# Patient Record
Sex: Male | Born: 1985 | Race: Black or African American | Hispanic: No | Marital: Married | Smoking: Current every day smoker
Health system: Southern US, Community
[De-identification: ages and names within clinical notes are randomized; demographics above are authoritative.]

---

## 2010-03-26 ENCOUNTER — Emergency Department (HOSPITAL_COMMUNITY): Admission: EM | Admit: 2010-03-26 | Discharge: 2010-03-26 | Payer: Self-pay | Admitting: Emergency Medicine

## 2010-03-28 ENCOUNTER — Inpatient Hospital Stay (HOSPITAL_COMMUNITY)
Admission: AD | Admit: 2010-03-28 | Discharge: 2010-04-03 | Payer: Self-pay | Source: Home / Self Care | Admitting: Specialist

## 2010-03-29 ENCOUNTER — Ambulatory Visit: Payer: Self-pay | Admitting: Infectious Disease

## 2010-08-01 LAB — CBC
HCT: 39.1 % (ref 39.0–52.0)
HCT: 39.7 % (ref 39.0–52.0)
HCT: 40.6 % (ref 39.0–52.0)
HCT: 41.8 % (ref 39.0–52.0)
HCT: 42.2 % (ref 39.0–52.0)
HCT: 43.5 % (ref 39.0–52.0)
HCT: 46.5 % (ref 39.0–52.0)
Hemoglobin: 13 g/dL (ref 13.0–17.0)
Hemoglobin: 13.2 g/dL (ref 13.0–17.0)
Hemoglobin: 13.6 g/dL (ref 13.0–17.0)
Hemoglobin: 14.1 g/dL (ref 13.0–17.0)
Hemoglobin: 14.8 g/dL (ref 13.0–17.0)
MCH: 26.9 pg (ref 26.0–34.0)
MCH: 27.6 pg (ref 26.0–34.0)
MCHC: 33.2 g/dL (ref 30.0–36.0)
MCHC: 33.2 g/dL (ref 30.0–36.0)
MCHC: 33.5 g/dL (ref 30.0–36.0)
MCHC: 34 g/dL (ref 30.0–36.0)
MCHC: 34.2 g/dL (ref 30.0–36.0)
MCV: 80.4 fL (ref 78.0–100.0)
MCV: 80.9 fL (ref 78.0–100.0)
MCV: 81.2 fL (ref 78.0–100.0)
RBC: 5.17 MIL/uL (ref 4.22–5.81)
RBC: 5.37 MIL/uL (ref 4.22–5.81)
RDW: 13.1 % (ref 11.5–15.5)
RDW: 13.1 % (ref 11.5–15.5)
RDW: 13.1 % (ref 11.5–15.5)
WBC: 12.2 10*3/uL — ABNORMAL HIGH (ref 4.0–10.5)
WBC: 12.7 10*3/uL — ABNORMAL HIGH (ref 4.0–10.5)
WBC: 16.8 10*3/uL — ABNORMAL HIGH (ref 4.0–10.5)
WBC: 8.9 10*3/uL (ref 4.0–10.5)

## 2010-08-01 LAB — CULTURE, BLOOD (ROUTINE X 2)
Culture  Setup Time: 201111082130
Culture: NO GROWTH

## 2010-08-01 LAB — BASIC METABOLIC PANEL
CO2: 29 mEq/L (ref 19–32)
CO2: 32 mEq/L (ref 19–32)
Chloride: 106 mEq/L (ref 96–112)
GFR calc Af Amer: >60 mL/min (ref >60)
Glucose, Bld: 102 mg/dL — ABNORMAL HIGH (ref 70–99)
Glucose, Bld: 88 mg/dL (ref 70–99)
Potassium: 3.8 mEq/L (ref 3.5–5.1)
Potassium: 3.9 mEq/L (ref 3.5–5.1)
Sodium: 140 mEq/L (ref 135–145)
Sodium: 141 mEq/L (ref 135–145)

## 2010-08-01 LAB — DIFFERENTIAL
Blasts: 0 %
Eosinophils Absolute: 0 10*3/uL (ref 0.0–0.7)
Eosinophils Relative: 0 % (ref 0–5)
Lymphocytes Relative: 18 % (ref 12–46)
Lymphs Abs: 3 10*3/uL (ref 0.7–4.0)
Monocytes Absolute: 1.6 10*3/uL — ABNORMAL HIGH (ref 0.1–1.0)
Monocytes Relative: 10 % (ref 3–12)
Myelocytes: 0 %
Neutro Abs: 11.9 10*3/uL — ABNORMAL HIGH (ref 1.7–7.7)
Neutro Abs: 21.9 10*3/uL — ABNORMAL HIGH (ref 1.7–7.7)
Neutrophils Relative %: 71 % (ref 43–77)
Neutrophils Relative %: 87 % — ABNORMAL HIGH (ref 43–77)
nRBC: 0 /100 WBC

## 2010-08-01 LAB — GRAM STAIN

## 2010-08-01 LAB — HEPATITIS PANEL, ACUTE
Hep A IgM: NEGATIVE
Hep B C IgM: NEGATIVE
Hepatitis B Surface Ag: NEGATIVE

## 2010-08-01 LAB — C-REACTIVE PROTEIN: CRP: 46.2 mg/dL — ABNORMAL HIGH (ref <0.6)

## 2010-08-01 LAB — ANAEROBIC CULTURE

## 2010-08-01 LAB — COMPREHENSIVE METABOLIC PANEL
Alkaline Phosphatase: 72 U/L (ref 39–117)
BUN: 7 mg/dL (ref 6–23)
Calcium: 9.4 mg/dL (ref 8.4–10.5)
GFR calc non Af Amer: >60 mL/min (ref >60)
Glucose, Bld: 97 mg/dL (ref 70–99)
Total Protein: 8.6 g/dL — ABNORMAL HIGH (ref 6.0–8.3)

## 2010-08-01 LAB — HIV ANTIBODY (ROUTINE TESTING W REFLEX): HIV: NONREACTIVE

## 2010-08-01 LAB — WOUND CULTURE

## 2010-08-01 LAB — URIC ACID: Uric Acid, Serum: 3.8 mg/dL — ABNORMAL LOW (ref 4.0–7.8)

## 2011-06-01 ENCOUNTER — Ambulatory Visit: Payer: Self-pay | Admitting: Endocrinology

## 2011-06-22 ENCOUNTER — Ambulatory Visit: Payer: Self-pay | Admitting: Endocrinology

## 2011-07-13 ENCOUNTER — Ambulatory Visit: Payer: Self-pay | Admitting: Endocrinology

## 2011-07-13 DIAGNOSIS — Z0289 Encounter for other administrative examinations: Secondary | ICD-10-CM

## 2019-07-07 ENCOUNTER — Other Ambulatory Visit: Payer: Self-pay

## 2019-07-07 ENCOUNTER — Encounter (HOSPITAL_COMMUNITY): Payer: Self-pay

## 2019-07-07 ENCOUNTER — Ambulatory Visit (HOSPITAL_COMMUNITY)
Admission: EM | Admit: 2019-07-07 | Discharge: 2019-07-07 | Disposition: A | Discharge status: Home / Self Care | Payer: Self-pay | Attending: Physician Assistant | Admitting: Physician Assistant

## 2019-07-07 DIAGNOSIS — S0990XA Unspecified injury of head, initial encounter: Secondary | ICD-10-CM

## 2019-07-07 DIAGNOSIS — S39012A Strain of muscle, fascia and tendon of lower back, initial encounter: Secondary | ICD-10-CM

## 2019-07-07 MED ORDER — IBUPROFEN 800 MG PO TABS: 800.0000 mg | ORAL_TABLET | Freq: Three times a day (TID) | ORAL | 0 refills | Status: DC

## 2019-07-07 MED ORDER — CYCLOBENZAPRINE HCL 10 MG PO TABS: 10.0000 mg | ORAL_TABLET | Freq: Two times a day (BID) | ORAL | 0 refills | Status: AC | PRN
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
## 2019-07-07 NOTE — Discharge Instructions (Signed)
I believe you have strained your lower back and have a mild head injury.  -monitor for blurry vision, nausea, vomiting, worsening headache, weakness, loss of consciousness. If any of these begin, please go to the Emergency department for further evaluation - if you have leg weakness, loss of control of bowels or bladder, please go to the emergency department  Begin taking the ibuprofen and flexeril today  Continue to do light exercise today with light walking and stretching. Ice your lower back region. Avoid bright lights and a lot of screen time.  Return to work with light duty to ensure your own safety and those around you.
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
## 2019-10-05 ENCOUNTER — Other Ambulatory Visit: Payer: Self-pay

## 2019-10-05 ENCOUNTER — Ambulatory Visit (HOSPITAL_COMMUNITY)
Admission: EM | Admit: 2019-10-05 | Discharge: 2019-10-05 | Disposition: A | Discharge status: Home / Self Care | Payer: HRSA Program | Attending: Urgent Care | Admitting: Urgent Care

## 2019-10-05 ENCOUNTER — Encounter (HOSPITAL_COMMUNITY): Payer: Self-pay

## 2019-10-05 DIAGNOSIS — Z20822 Contact with and (suspected) exposure to covid-19: Secondary | ICD-10-CM | POA: Insufficient documentation

## 2019-10-05 DIAGNOSIS — F17210 Nicotine dependence, cigarettes, uncomplicated: Secondary | ICD-10-CM | POA: Diagnosis not present

## 2019-10-05 DIAGNOSIS — R05 Cough: Secondary | ICD-10-CM | POA: Diagnosis not present

## 2019-10-05 DIAGNOSIS — R059 Cough, unspecified: Secondary | ICD-10-CM

## 2019-10-05 NOTE — Discharge Instructions (Signed)
Most likely allergies. Await covid test results.

## 2019-10-05 NOTE — ED Triage Notes (Signed)
Pt states he needs a Covid test for work. Pt states he had a cough. Pt states he only coughed one time at work and then he was sent to get a Covid test today. This happened today.

## 2019-10-05 NOTE — ED Provider Notes (Signed)
George Valencia    CSN: [id number] Arrival date & time: 10/05/19  1607      History   Chief Complaint Chief Complaint  Patient presents with  . Cough    HPI George Valencia is a 34 y.o. male.   Patient is a 34 year old male presents today with cough and needing Covid testing.  Reporting that he was at work today and had coughing episode and his boss recommended he get Covid testing.  He otherwise has been feeling well.  Reports thinks the cough may be related to allergies.  He takes Claritin daily.  Denies any nasal congestion, rhinorrhea, fever, chills, body aches.  ROS per HPI      History reviewed. No pertinent past medical history.  There are no problems to display for this patient.   History reviewed. No pertinent surgical history.     Home Medications    Prior to Admission medications   Medication Sig Start Date End Date Taking? Authorizing Provider  cyclobenzaprine (FLEXERIL) 10 MG tablet Take 1 tablet (10 mg total) by mouth 2 (two) times daily as needed for muscle spasms. 07/07/19   Darr, Marguerita Beards, PA-C  ibuprofen (ADVIL) 800 MG tablet Take 1 tablet (800 mg total) by mouth 3 (three) times daily. 07/07/19   Darr, Marguerita Beards, PA-C    Family History History reviewed. No pertinent family history.  Social History Social History   Tobacco Use  . Smoking status: Current Every Day Smoker    Packs/day: 0.50    Types: Cigarettes  . Smokeless tobacco: Never Used  Substance Use Topics  . Alcohol use: Yes  . Drug use: Yes    Types: Marijuana     Allergies   Patient has no known allergies.   Review of Systems Review of Systems   Physical Exam Triage Vital Signs ED Triage Vitals  Enc Vitals Group     BP 10/05/19 0949 138/76     Pulse Rate 10/05/19 0949 68     Resp 10/05/19 0949 18     Temp 10/05/19 0949 98.2 F (36.8 C)     Temp src --      SpO2 --      Weight 10/05/19 0929 164 lb (74.4 kg)     Height --      Head Circumference --     Peak Flow --      Pain Score 10/05/19 0929 0     Pain Loc --      Pain Edu? --      Excl. in [place]? --    No data found.  Updated Vital Signs BP 138/76 (BP Location: Right Arm)   Pulse 68   Temp 98.2 F (36.8 C)   Resp 18   Wt 164 lb (74.4 kg)   SpO2 98%   Visual Acuity Right Eye Distance:   Left Eye Distance:   Bilateral Distance:    Right Eye Near:   Left Eye Near:    Bilateral Near:     Physical Exam Vitals and nursing note reviewed.  Constitutional:      Appearance: Normal appearance.  HENT:     Head: Normocephalic and atraumatic.     Nose: Nose normal.  Eyes:     Conjunctiva/sclera: Conjunctivae normal.  Cardiovascular:     Rate and Rhythm: Normal rate.  Pulmonary:     Effort: Pulmonary effort is normal.     Breath sounds: Normal breath sounds.  Musculoskeletal:  General: Normal range of motion.     Cervical back: Normal range of motion.  Skin:    General: Skin is warm and dry.  Neurological:     Mental Status: He is alert.  Psychiatric:        Mood and Affect: Mood normal.      UC Treatments / Results  Labs (all labs ordered are listed, but only abnormal results are displayed) Labs Reviewed  SARS CORONAVIRUS 2 (TAT 6-24 HRS)    EKG   Radiology No results found.  Procedures Procedures (including critical care time)  Medications Ordered in UC Medications - No data to display  Initial Impression / Assessment and Plan / UC Course  I have reviewed the triage vital signs and the nursing notes.  Pertinent labs & imaging results that were available during my care of the patient were reviewed by me and considered in my medical decision making (see chart for details).     Cough Most likely allergy related.  Patient not having any other symptoms and was isolated episode Recommend keep taking Claritin. Also recommend adding Flonase Covid test pending he can check his MyChart for results Final Clinical Impressions(s) / UC Diagnoses    Final diagnoses:  Cough     Discharge Instructions     Most likely allergies. Await covid test results.     ED Prescriptions    None     PDMP not reviewed this encounter.   Janace Aris, NP 10/05/19 [id number]

## 2019-10-06 LAB — SARS CORONAVIRUS 2 (TAT 6-24 HRS): SARS Coronavirus 2: NEGATIVE

## 2020-07-29 ENCOUNTER — Encounter (HOSPITAL_COMMUNITY): Payer: Self-pay

## 2020-07-29 ENCOUNTER — Ambulatory Visit (HOSPITAL_COMMUNITY)
Admission: EM | Admit: 2020-07-29 | Discharge: 2020-07-29 | Disposition: A | Discharge status: Home / Self Care | Payer: PRIVATE HEALTH INSURANCE | Attending: Emergency Medicine | Admitting: Emergency Medicine

## 2020-07-29 ENCOUNTER — Ambulatory Visit (INDEPENDENT_AMBULATORY_CARE_PROVIDER_SITE_OTHER): Payer: PRIVATE HEALTH INSURANCE

## 2020-07-29 ENCOUNTER — Other Ambulatory Visit: Payer: Self-pay

## 2020-07-29 DIAGNOSIS — M79672 Pain in left foot: Secondary | ICD-10-CM | POA: Diagnosis not present

## 2020-07-29 MED ORDER — IBUPROFEN 800 MG PO TABS: 800.0000 mg | ORAL_TABLET | Freq: Three times a day (TID) | ORAL | 0 refills | Status: AC | PRN

## 2020-07-29 NOTE — Discharge Instructions (Signed)
Take the ibuprofen as prescribed.  Rest and elevate your foot.  Apply ice packs 2-3 times a day for up to 20 minutes each.      Follow up with an orthopedist if your symptoms are not improving.    

## 2020-07-29 NOTE — ED Provider Notes (Signed)
[facility]    CSN: [id number] Arrival date & time: 07/29/20  7673      History   Chief Complaint Chief Complaint  Patient presents with  . Foot Injury    HPI George Valencia is a 35 y.o. male.   Patient presents with pain in his left foot in the heel area after he injured it while playing basketball yesterday.  He states he jumped up and then landed on his heel; heard a "pop" and has been in pain since.  The pain is worse with weightbearing.  Treatment attempted at home with ibuprofen and ice packs.  He denies numbness, weakness, paresthesias, open wounds, redness, bruising, or other symptoms.  No pertinent medical history.  The history is provided by the patient.    History reviewed. No pertinent past medical history.  There are no problems to display for this patient.   History reviewed. No pertinent surgical history.     Home Medications    Prior to Admission medications   Medication Sig Start Date End Date Taking? Authorizing Provider  ibuprofen (ADVIL) 800 MG tablet Take 1 tablet (800 mg total) by mouth every 8 (eight) hours as needed. 07/29/20  Yes Mickie Bail, NP  cyclobenzaprine (FLEXERIL) 10 MG tablet Take 1 tablet (10 mg total) by mouth 2 (two) times daily as needed for muscle spasms. 07/07/19   Darr, Gerilyn Pilgrim, PA-C    Family History History reviewed. No pertinent family history.  Social History Social History   Tobacco Use  . Smoking status: Current Every Day Smoker    Packs/day: 0.50    Types: Cigarettes  . Smokeless tobacco: Never Used  Substance Use Topics  . Alcohol use: Yes  . Drug use: Yes    Types: Marijuana     Allergies   Patient has no known allergies.   Review of Systems Review of Systems  Constitutional: Negative for chills and fever.  HENT: Negative for ear pain and sore throat.   Eyes: Negative for pain and visual disturbance.  Respiratory: Negative for cough and shortness of breath.   Cardiovascular: Negative  for chest pain and palpitations.  Gastrointestinal: Negative for abdominal pain and vomiting.  Genitourinary: Negative for dysuria and hematuria.  Musculoskeletal: Positive for arthralgias and gait problem. Negative for joint swelling.  Skin: Negative for color change and rash.  Neurological: Negative for syncope, weakness and numbness.  All other systems reviewed and are negative.    Physical Exam Triage Vital Signs ED Triage Vitals  Enc Vitals Group     BP      Pulse      Resp      Temp      Temp src      SpO2      Weight      Height      Head Circumference      Peak Flow      Pain Score      Pain Loc      Pain Edu?      Excl. in [place]?    No data found.  Updated Vital Signs BP (!) 151/88   Pulse 68   Temp 97.9 F (36.6 C)   Resp 20   SpO2 98%   Visual Acuity Right Eye Distance:   Left Eye Distance:   Bilateral Distance:    Right Eye Near:   Left Eye Near:    Bilateral Near:     Physical Exam Vitals and nursing note reviewed.  Constitutional:      General: He is not in acute distress.    Appearance: He is well-developed.  HENT:     Head: Normocephalic and atraumatic.     Mouth/Throat:     Mouth: Mucous membranes are moist.  Eyes:     Conjunctiva/sclera: Conjunctivae normal.  Cardiovascular:     Rate and Rhythm: Normal rate and regular rhythm.     Heart sounds: Normal heart sounds.  Pulmonary:     Effort: Pulmonary effort is normal. No respiratory distress.     Breath sounds: Normal breath sounds.  Abdominal:     Palpations: Abdomen is soft.     Tenderness: There is no abdominal tenderness.  Musculoskeletal:        General: Tenderness present. No swelling or deformity. Normal range of motion.     Cervical back: Neck supple.       Feet:  Skin:    General: Skin is warm and dry.     Capillary Refill: Capillary refill takes less than 2 seconds.     Findings: No bruising, erythema, lesion or rash.  Neurological:     General: No focal deficit  present.     Mental Status: He is alert and oriented to person, place, and time.     Sensory: No sensory deficit.     Motor: No weakness.     Gait: Gait abnormal.  Psychiatric:        Mood and Affect: Mood normal.        Behavior: Behavior normal.      UC Treatments / Results  Labs (all labs ordered are listed, but only abnormal results are displayed) Labs Reviewed - No data to display  EKG   Radiology DG Foot Complete Left  Result Date: 07/29/2020 CLINICAL DATA:  35 year old male status post basketball injury last night. "Heard a pop". Persistent pain and swelling. EXAM: LEFT FOOT - COMPLETE 3+ VIEW COMPARISON:  None. FINDINGS: Bone mineralization is within normal limits. 1st MTP joint space loss, mild subchondral sclerosis and osteophytosis. There is also chronic but pronounced degenerative spurring at the anterior talus. No acute osseous abnormality identified. Other joint spaces and alignment appear normal. No evidence of ankle joint effusion. No discrete soft tissue injury. IMPRESSION: No acute osseous abnormality identified. 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 Electronically Signed   By: Odessa Fleming M.D.   On: 07/29/2020 10:15    Procedures Procedures (including critical care time)  Medications Ordered in UC Medications - No data to display  Initial Impression / Assessment and Plan / UC Course  I have reviewed the triage vital signs and the nursing notes.  Pertinent labs & imaging results that were available during my care of the patient were reviewed by me and considered in my medical decision making (see chart for details).   Left foot pain.  X-ray shows no acute bony abnormality.  Treating with ibuprofen, rest, elevation, ice packs.  Instructed patient to follow-up with an orthopedist if his symptoms are not improving.  He agrees to plan of care.   Final Clinical Impressions(s) / UC Diagnoses   Final diagnoses:  Left foot pain      Discharge Instructions     Take the ibuprofen as prescribed.  Rest and elevate your foot.  Apply ice packs 2-3 times a day for up to 20 minutes each.    Follow up with an orthopedist if your symptoms are not improving.  ED Prescriptions    Medication Sig Dispense Auth. Provider   ibuprofen (ADVIL) 800 MG tablet Take 1 tablet (800 mg total) by mouth every 8 (eight) hours as needed. 21 tablet Mickie Bail, NP     PDMP not reviewed this encounter.   Mickie Bail, NP 07/29/20 1038

## 2020-07-29 NOTE — ED Triage Notes (Signed)
Pt in with c/o left foot injury that happened last night when he was playing basketball and landed wrong.   States he heard a popping noise when he landed and now he can't put weight on his leg  Pt took ibuprofen and iced his foot with no relief

## 2021-02-07 ENCOUNTER — Emergency Department (HOSPITAL_COMMUNITY): Payer: PRIVATE HEALTH INSURANCE

## 2021-02-07 ENCOUNTER — Ambulatory Visit (HOSPITAL_COMMUNITY)
Admission: EM | Admit: 2021-02-07 | Discharge: 2021-02-07 | Disposition: A | Discharge status: Home / Self Care | Payer: PRIVATE HEALTH INSURANCE

## 2021-02-07 ENCOUNTER — Emergency Department (HOSPITAL_COMMUNITY)
Admission: EM | Admit: 2021-02-07 | Discharge: 2021-02-07 | Disposition: A | Discharge status: Home / Self Care | Payer: PRIVATE HEALTH INSURANCE | Attending: Emergency Medicine | Admitting: Emergency Medicine

## 2021-02-07 ENCOUNTER — Other Ambulatory Visit: Payer: Self-pay

## 2021-02-07 DIAGNOSIS — R0602 Shortness of breath: Secondary | ICD-10-CM | POA: Insufficient documentation

## 2021-02-07 DIAGNOSIS — R059 Cough, unspecified: Secondary | ICD-10-CM | POA: Insufficient documentation

## 2021-02-07 DIAGNOSIS — Z5321 Procedure and treatment not carried out due to patient leaving prior to being seen by health care provider: Secondary | ICD-10-CM | POA: Insufficient documentation

## 2021-02-07 DIAGNOSIS — Z751 Person awaiting admission to adequate facility elsewhere: Secondary | ICD-10-CM

## 2021-02-07 DIAGNOSIS — R079 Chest pain, unspecified: Secondary | ICD-10-CM | POA: Diagnosis not present

## 2021-02-07 LAB — CBC
HCT: 46.5 % (ref 39.0–52.0)
Hemoglobin: 15.1 g/dL (ref 13.0–17.0)
MCH: 26.9 pg (ref 26.0–34.0)
MCHC: 32.5 g/dL (ref 30.0–36.0)
MCV: 82.9 fL (ref 80.0–100.0)
Platelets: 350 10*3/uL (ref 150–400)
RBC: 5.61 MIL/uL (ref 4.22–5.81)
RDW: 14 % (ref 11.5–15.5)
WBC: 10.1 10*3/uL (ref 4.0–10.5)
nRBC: 0 % (ref 0.0–0.2)

## 2021-02-07 LAB — TROPONIN I (HIGH SENSITIVITY)
Troponin I (High Sensitivity): 7 ng/L (ref <18)
Troponin I (High Sensitivity): 22 ng/L — ABNORMAL HIGH (ref <18)

## 2021-02-07 LAB — BASIC METABOLIC PANEL
Anion gap: 10 (ref 5–15)
BUN: 12 mg/dL (ref 6–20)
CO2: 24 mmol/L (ref 22–32)
Calcium: 9.5 mg/dL (ref 8.9–10.3)
Chloride: 103 mmol/L (ref 98–111)
Creatinine, Ser: 0.88 mg/dL (ref 0.61–1.24)
GFR, Estimated: >60 mL/min (ref >60)
Glucose, Bld: 94 mg/dL (ref 70–99)
Potassium: 3.8 mmol/L (ref 3.5–5.1)
Sodium: 137 mmol/L (ref 135–145)

## 2021-02-07 NOTE — ED Triage Notes (Addendum)
Pt in with c/o sharp CP that started this morning. States that when he left work he started feeling numbness in his feet   Pt states pain is radiating to his arms and making them feel numb.   Denies any hx of Htn or cardiac issues  Roosvelt Maser, PA into triage to assess pt status  Advised pt be worked up in ED due to EKG changes

## 2021-02-07 NOTE — ED Notes (Signed)
Pt stated they were leaving, IV removed  

## 2021-02-07 NOTE — ED Notes (Signed)
[facility] do not have truck available   EMS called, emergency transport, cardiac monitor.   ED charge nurse called and reported pt will go by EMS.

## 2021-02-07 NOTE — ED Provider Notes (Signed)
Emergency Medicine Provider Triage Evaluation Note  George Valencia , a 35 y.o. male  was evaluated in triage.  Pt complains of chest pain since this morning. Describes his pain as pressure in the middle of his chest. Went to work and had some associated cough and SOB. Went to UC who sent him here via EMS. Was given 324mg  ASA en route. Pain is 5/10. At home COVID test this morning was negative.   Review of Systems  Positive: CP, SOB, cough Negative: Nausea, vomiting  Physical Exam  BP (!) 133/99 (BP Location: Left Arm)   Pulse (!) 58   Temp 98.5 F (36.9 C) (Oral)   Resp 18   SpO2 97%  Gen:   Awake, no distress   Resp:  Normal effort  MSK:   Moves extremities without difficulty  Other:    Medical Decision Making  Medically screening exam initiated at 4:59 PM.  Appropriate orders placed.  Torryn Hudspeth was informed that the remainder of the evaluation will be completed by another provider, this initial triage assessment does not replace that evaluation, and the importance of remaining in the ED until their evaluation is complete.     Franz Dell 02/07/21 1702    [place], 02/09/21, MD 02/07/21 02/09/21

## 2021-02-07 NOTE — ED Notes (Signed)
Patient is being discharged from the Urgent Care and sent to the Emergency Department via EMS . Per Roosvelt Maser, PA, patient is in need of higher level of care due to EKG changes. Patient is aware and verbalizes understanding of plan of care.  Vitals:   02/07/21 1616 02/07/21 1624  BP:  134/83  Pulse:    Resp:    Temp: 97.6 F (36.4 C)   SpO2:

## 2021-02-07 NOTE — ED Triage Notes (Signed)
Pt report sharp chest pain that started this morning. Pt reports pain is worse with coughing and palpation. Pt was sent from urgent care via [facility]. Pt reports pain of 5/10.

## 2021-03-17 ENCOUNTER — Other Ambulatory Visit: Payer: Self-pay

## 2021-03-17 ENCOUNTER — Encounter (HOSPITAL_COMMUNITY): Payer: Self-pay

## 2021-03-17 ENCOUNTER — Ambulatory Visit (HOSPITAL_COMMUNITY)
Admission: EM | Admit: 2021-03-17 | Discharge: 2021-03-17 | Disposition: A | Discharge status: Home / Self Care | Payer: PRIVATE HEALTH INSURANCE | Attending: Emergency Medicine | Admitting: Emergency Medicine

## 2021-03-17 DIAGNOSIS — Z20822 Contact with and (suspected) exposure to covid-19: Secondary | ICD-10-CM | POA: Insufficient documentation

## 2021-03-17 NOTE — ED Triage Notes (Signed)
Pt presents for covid testing with no known symptoms. 

## 2021-03-18 LAB — SARS CORONAVIRUS 2 (TAT 6-24 HRS): SARS Coronavirus 2: NEGATIVE

## 2021-05-13 ENCOUNTER — Encounter (HOSPITAL_COMMUNITY): Payer: Self-pay

## 2021-05-13 ENCOUNTER — Ambulatory Visit (HOSPITAL_COMMUNITY)
Admission: EM | Admit: 2021-05-13 | Discharge: 2021-05-13 | Disposition: A | Discharge status: Home / Self Care | Payer: PRIVATE HEALTH INSURANCE | Attending: Physician Assistant | Admitting: Physician Assistant

## 2021-05-13 DIAGNOSIS — H1031 Unspecified acute conjunctivitis, right eye: Secondary | ICD-10-CM | POA: Diagnosis not present

## 2021-05-13 MED ORDER — POLYMYXIN B-TRIMETHOPRIM 10000-0.1 UNIT/ML-% OP SOLN: 1.0000 [drp] | OPHTHALMIC | 0 refills | Status: AC

## 2021-05-13 NOTE — ED Provider Notes (Signed)
[facility]    CSN: [id number] Arrival date & time: 05/13/21  1318      History   Chief Complaint Chief Complaint  Patient presents with   Conjunctivitis    HPI George Valencia is a 35 y.o. male.   Patient here today for evaluation of right eye irritation that started yesterday.  He has had some drainage from his eye as well.  He states that he is not sure if he accidentally hurt his eye when he was helping his sister move as he did have a pillow rub against his eye.  He denies any sensation of foreign body.  He is not photophobic.  He does not report any treatment for symptoms.  The history is provided by the patient.  Conjunctivitis Pertinent negatives include no shortness of breath.   History reviewed. No pertinent past medical history.  There are no problems to display for this patient.   History reviewed. No pertinent surgical history.     Home Medications    Prior to Admission medications   Medication Sig Start Date End Date Taking? Authorizing Provider  trimethoprim-polymyxin b (POLYTRIM) ophthalmic solution Place 1 drop into the right eye every 4 (four) hours for 7 days. 05/13/21 05/20/21 Yes Tomi Bamberger, PA-C  cyclobenzaprine (FLEXERIL) 10 MG tablet Take 1 tablet (10 mg total) by mouth 2 (two) times daily as needed for muscle spasms. 07/07/19   Darr, Gerilyn Pilgrim, PA-C  ibuprofen (ADVIL) 800 MG tablet Take 1 tablet (800 mg total) by mouth every 8 (eight) hours as needed. 07/29/20   Mickie Bail, NP    Family History Family History  Family history unknown: Yes    Social History Social History   Tobacco Use   Smoking status: Every Day    Packs/day: 0.50    Types: Cigarettes   Smokeless tobacco: Never  Substance Use Topics   Alcohol use: Yes   Drug use: Yes    Types: Marijuana     Allergies   Patient has no known allergies.   Review of Systems Review of Systems  Constitutional:  Negative for chills and fever.  HENT:  Negative  for congestion.   Eyes:  Positive for discharge and redness. Negative for photophobia and pain.  Respiratory:  Negative for shortness of breath.   Skin:  Positive for color change and wound.  Neurological:  Negative for numbness.    Physical Exam Triage Vital Signs ED Triage Vitals  Enc Vitals Group     BP 05/13/21 1344 125/79     Pulse Rate 05/13/21 1344 71     Resp 05/13/21 1344 18     Temp 05/13/21 1344 98.1 F (36.7 C)     Temp Source 05/13/21 1344 Oral     SpO2 05/13/21 1344 98 %     Weight --      Height --      Head Circumference --      Peak Flow --      Pain Score 05/13/21 1343 3     Pain Loc --      Pain Edu? --      Excl. in GC? --    No data found.  Updated Vital Signs BP 125/79 (BP Location: Left Arm)    Pulse 71    Temp 98.1 F (36.7 C) (Oral)    Resp 18    SpO2 98%     Physical Exam Vitals and nursing note reviewed.  Constitutional:  General: He is not in acute distress.    Appearance: Normal appearance. He is not ill-appearing.  HENT:     Head: Normocephalic and atraumatic.  Eyes:     General: Lids are normal.        Right eye: No foreign body.     Extraocular Movements: Extraocular movements intact.     Conjunctiva/sclera:     Right eye: Right conjunctiva is injected.     Left eye: Left conjunctiva is not injected.  Cardiovascular:     Rate and Rhythm: Normal rate.  Pulmonary:     Effort: Pulmonary effort is normal.  Neurological:     Mental Status: He is alert.  Psychiatric:        Mood and Affect: Mood normal.        Behavior: Behavior normal.        Thought Content: Thought content normal.     UC Treatments / Results  Labs (all labs ordered are listed, but only abnormal results are displayed) Labs Reviewed - No data to display  EKG   Radiology No results found.  Procedures Procedures (including critical care time)  Medications Ordered in UC Medications - No data to display  Initial Impression / Assessment and Plan /  UC Course  I have reviewed the triage vital signs and the nursing notes.  Pertinent labs & imaging results that were available during my care of the patient were reviewed by me and considered in my medical decision making (see chart for details).    Very low suspicion of corneal abrasion given lack of photosensitivity and no sensation of foreign body.  We will treat for conjunctivitis with antibiotic drop.  Recommended follow-up if symptoms fail to improve or worsen.  Final Clinical Impressions(s) / UC Diagnoses   Final diagnoses:  Acute conjunctivitis of right eye, unspecified acute conjunctivitis type   Discharge Instructions   None    ED Prescriptions     Medication Sig Dispense Auth. Provider   trimethoprim-polymyxin b (POLYTRIM) ophthalmic solution Place 1 drop into the right eye every 4 (four) hours for 7 days. 10 mL Tomi Bamberger, PA-C      PDMP not reviewed this encounter.   Tomi Bamberger, PA-C 05/13/21 [phone]

## 2021-05-13 NOTE — ED Triage Notes (Signed)
Pt presents with right eye irritation and drainage since yesterday.

## 2021-12-06 IMAGING — DX DG FOOT COMPLETE 3+V*L*
3 series · 3 of 3 positions shown · non-contrast
Comparison: None.

CLINICAL DATA: 34-year-old male status post basketball injury last
night. "Heard a pop". Persistent pain and swelling.

EXAM:
LEFT FOOT - COMPLETE 3+ VIEW

[foot ap]
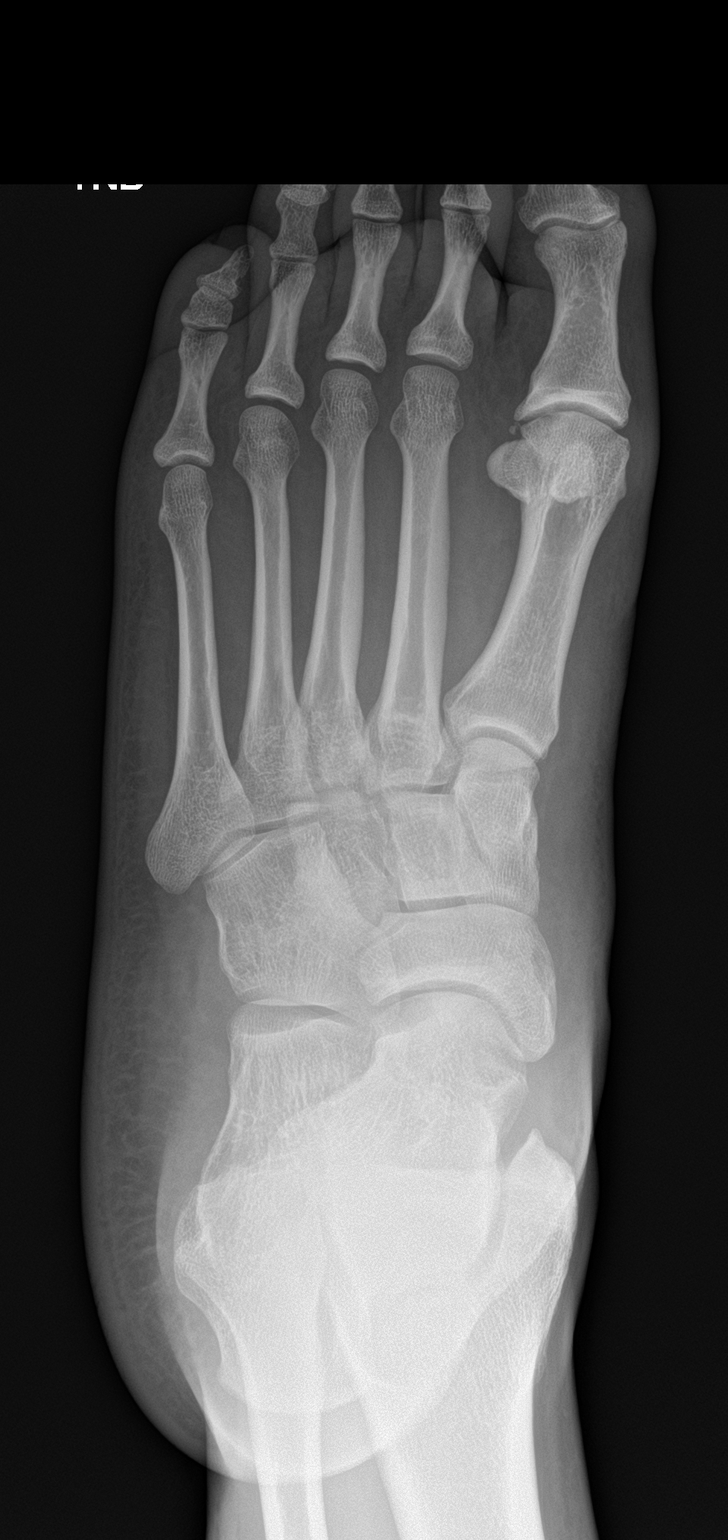

[foot obl]
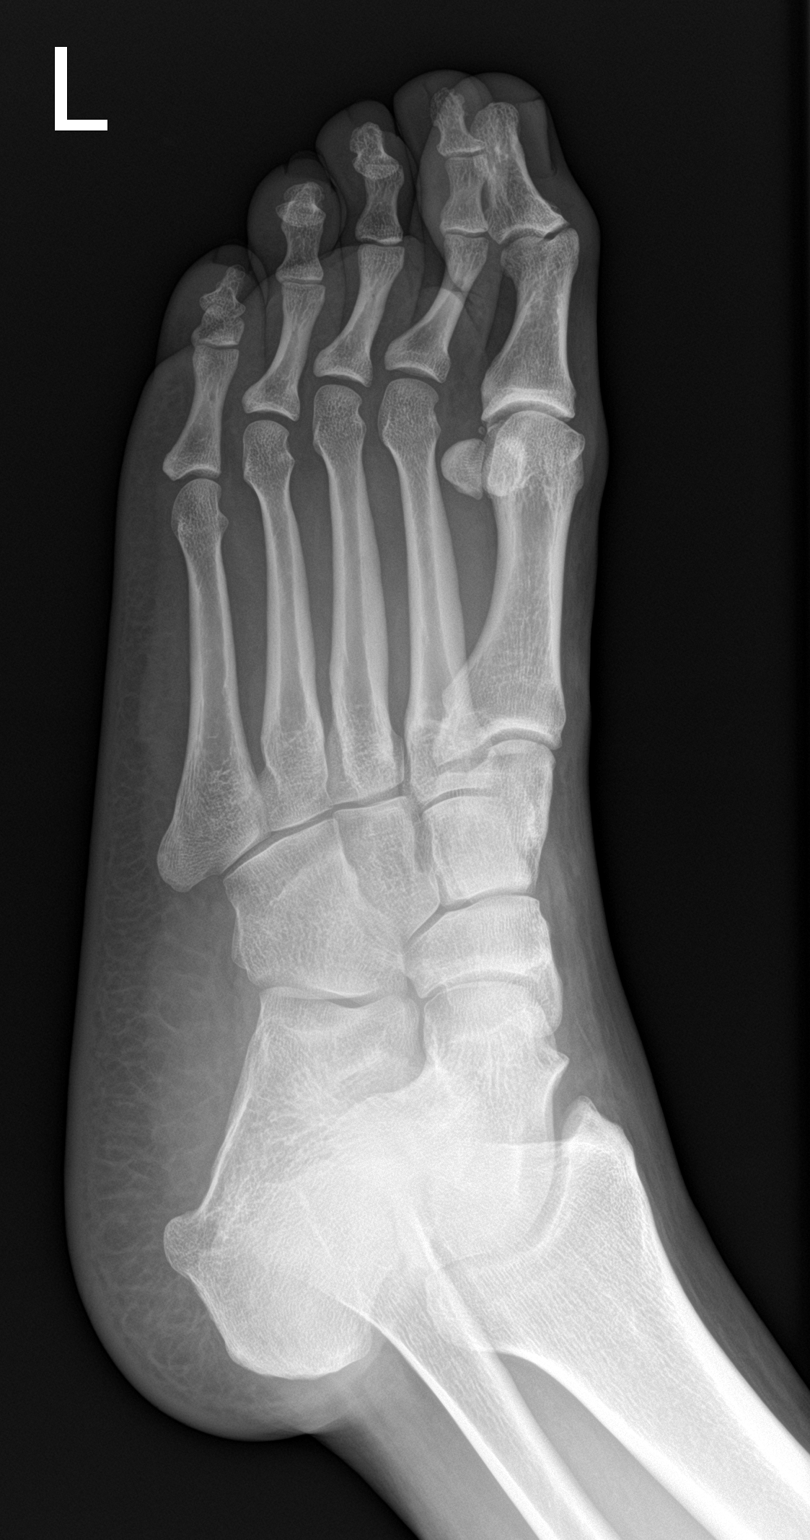

[foot lat]
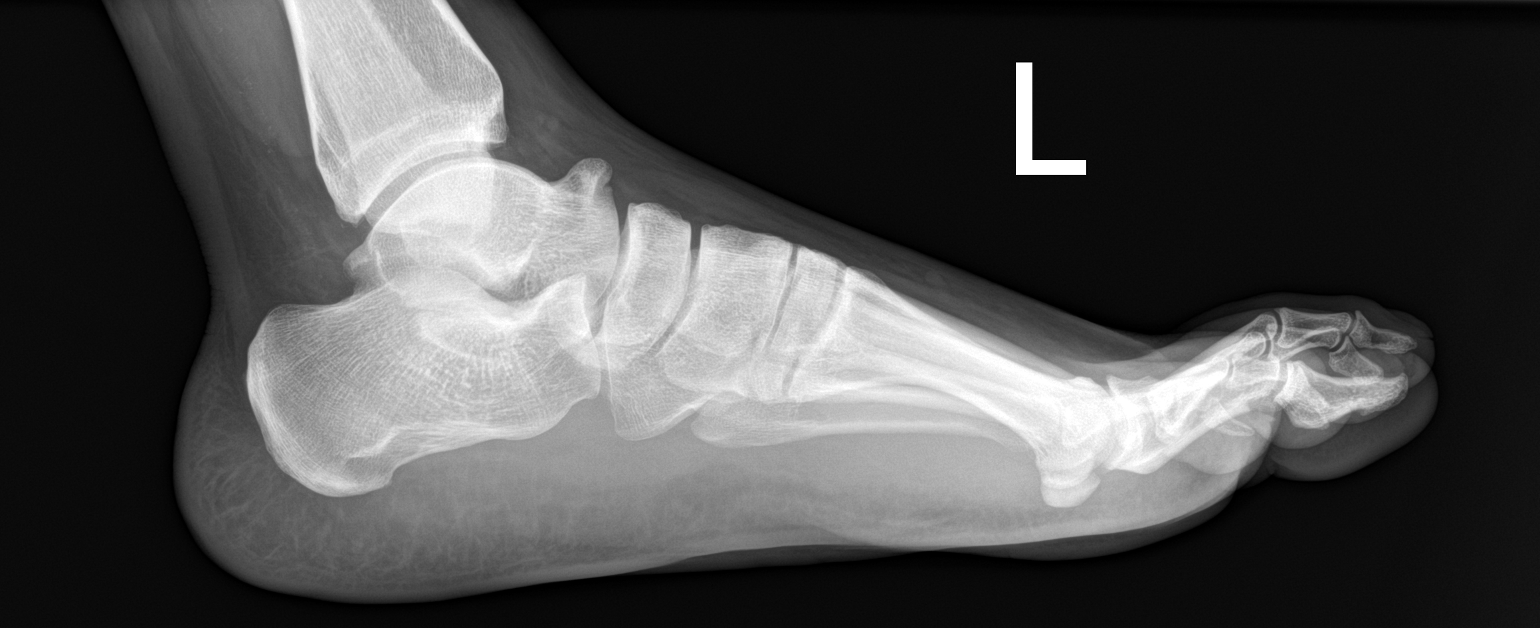

[3 of 3 positions shown; findings below may reference images not displayed]

FINDINGS: Bone mineralization is within normal limits. 1st MTP joint space
loss, mild subchondral sclerosis and osteophytosis. There is also
chronic but pronounced degenerative spurring at the anterior talus.
No acute osseous abnormality identified. Other joint spaces and
alignment appear normal. No evidence of ankle joint effusion. No
discrete soft tissue injury.
IMPRESSION: No acute osseous abnormality identified. Chronic spurring at the
anterior talus and 1st MTP osteoarthritis.

## 2022-06-17 IMAGING — CR DG CHEST 2V
2 series · 2 of 2 positions shown · non-contrast
Comparison: None.

CLINICAL DATA: Chest pain.

EXAM:
CHEST - 2 VIEW

[chest pa]
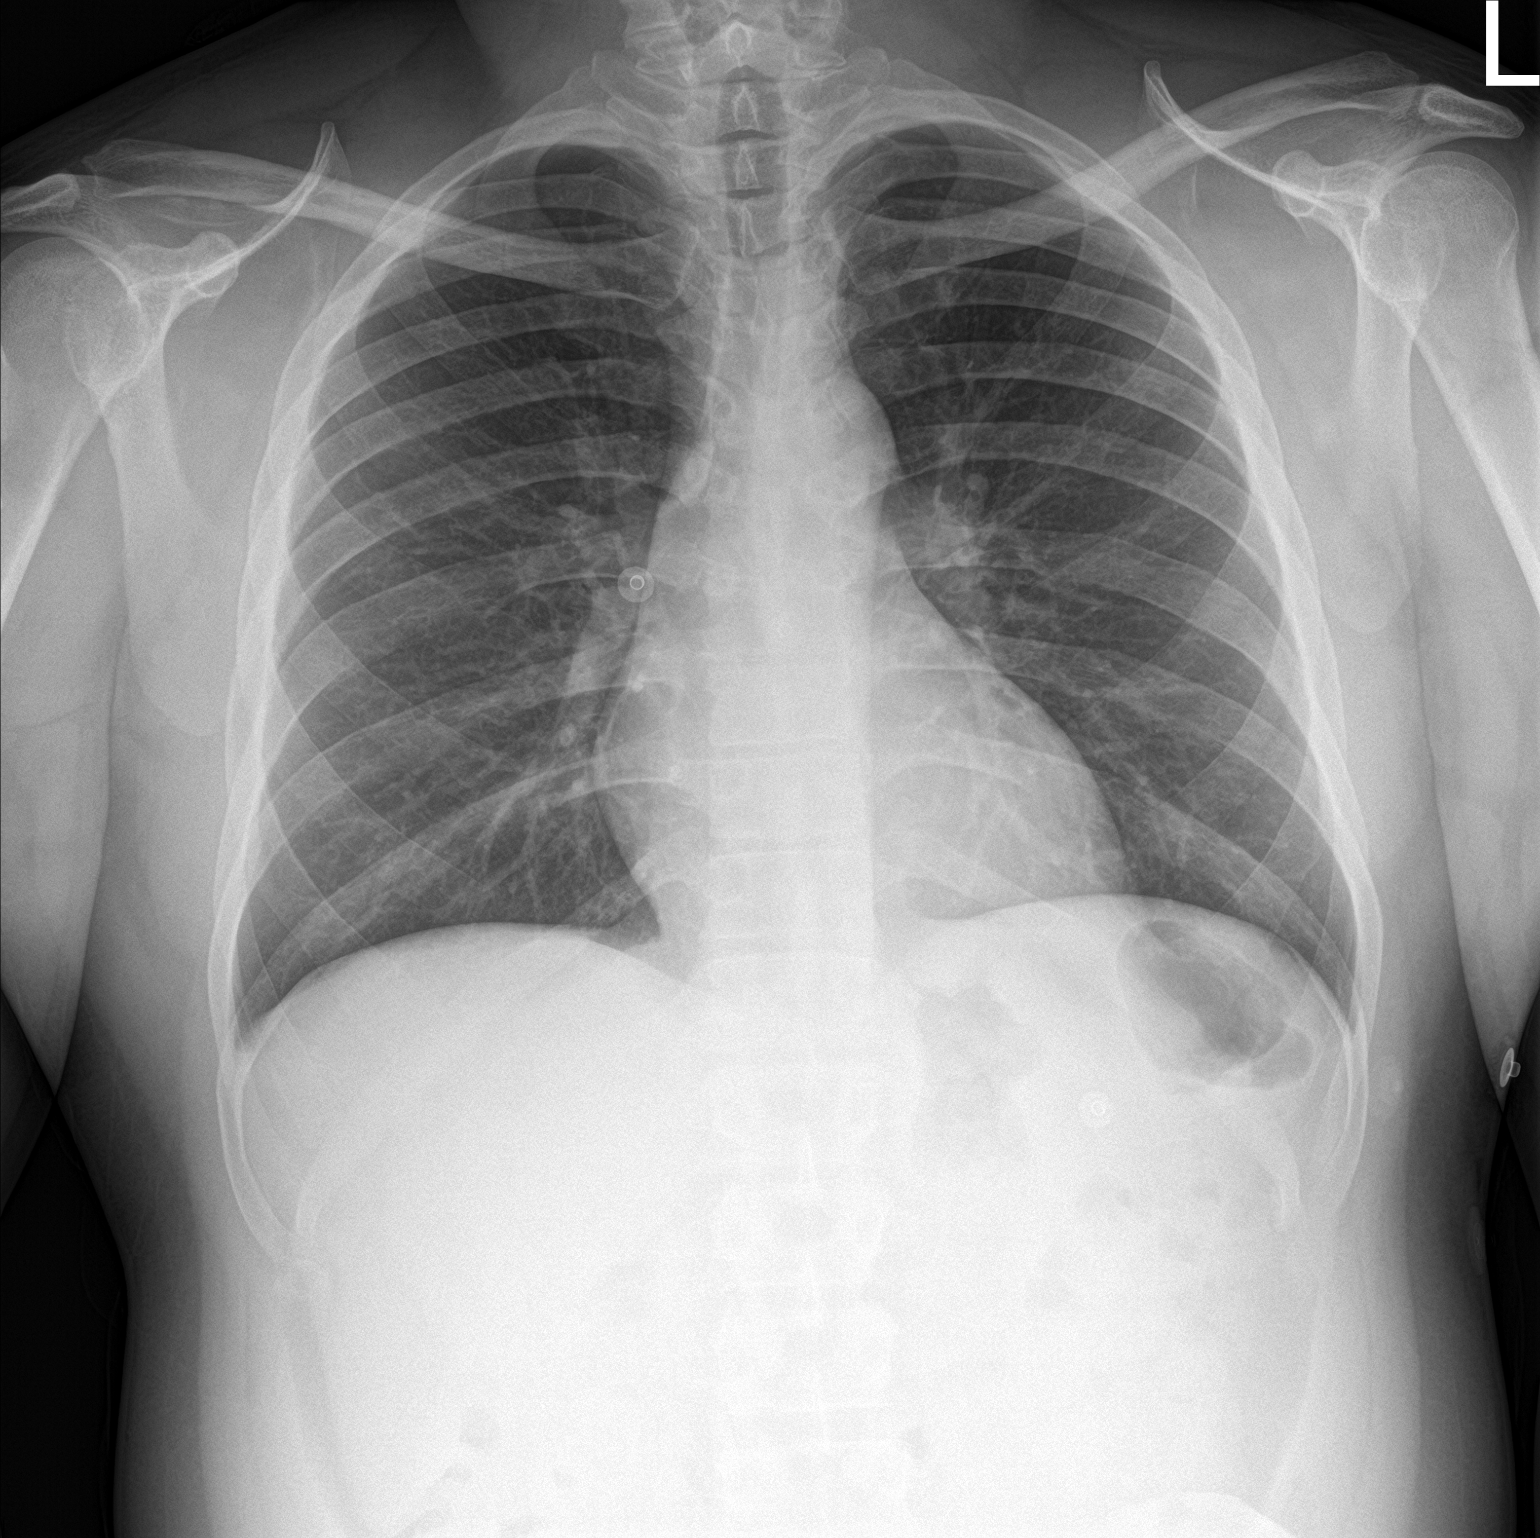

[chest lat]
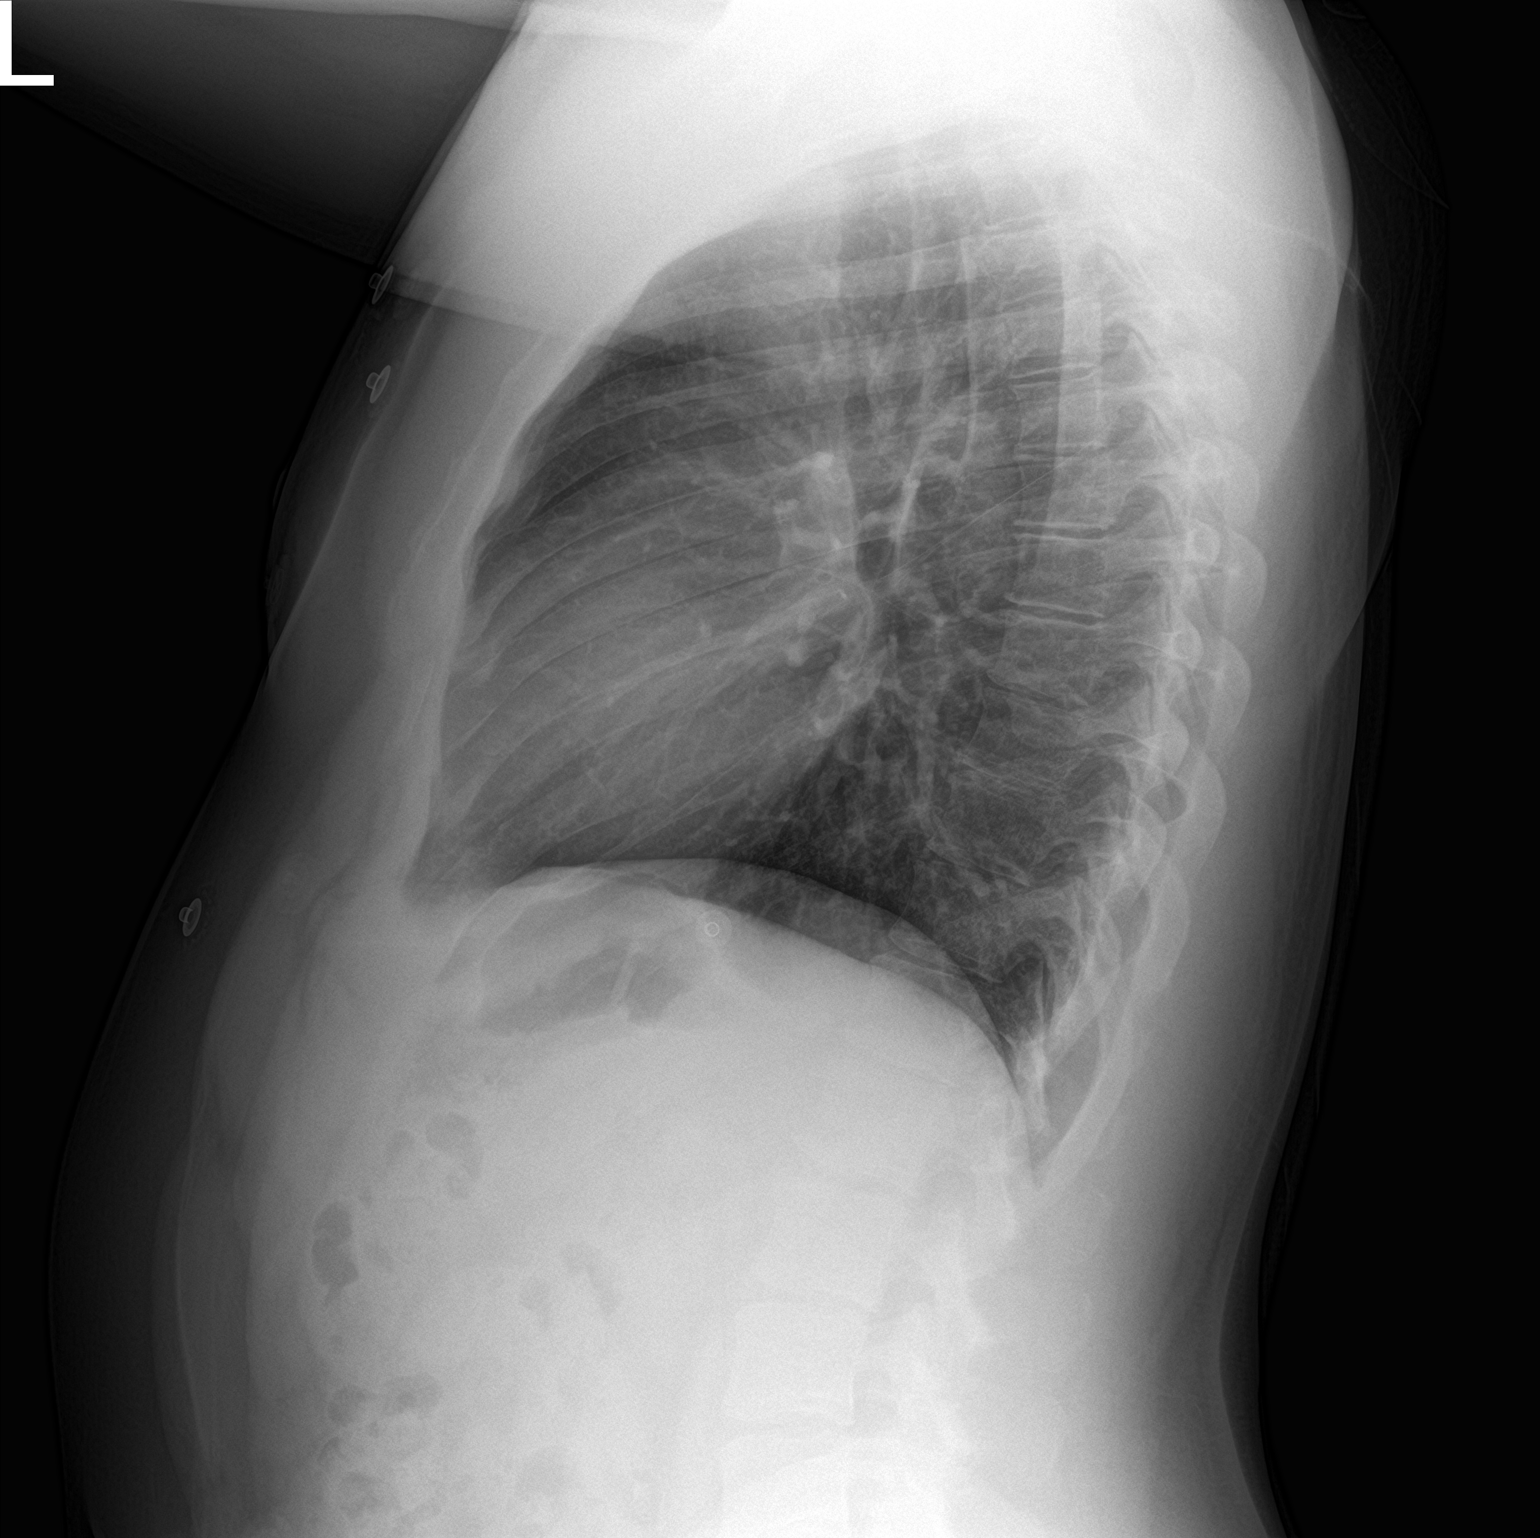

[2 of 2 positions shown; findings below may reference images not displayed]

FINDINGS: The heart size and mediastinal contours are within normal limits.
Both lungs are clear. The visualized skeletal structures are
unremarkable.
IMPRESSION: No active cardiopulmonary disease.
